# Patient Record
Sex: Female | Born: 1960 | Race: White | Hispanic: No | Marital: Married | Smoking: Never smoker
Health system: Southern US, Community
[De-identification: ages and names within clinical notes are randomized; demographics above are authoritative.]

## PROBLEM LIST (undated history)

## (undated) HISTORY — PX: REDUCTION MAMMAPLASTY: SUR839

## (undated) HISTORY — PX: ABDOMINAL HYSTERECTOMY: SHX81

---

## 2004-02-28 ENCOUNTER — Ambulatory Visit: Payer: Self-pay

## 2005-07-23 ENCOUNTER — Ambulatory Visit: Payer: Self-pay | Admitting: Obstetrics and Gynecology

## 2006-07-29 ENCOUNTER — Ambulatory Visit: Payer: Self-pay | Admitting: Obstetrics and Gynecology

## 2007-08-13 ENCOUNTER — Ambulatory Visit: Payer: Self-pay | Admitting: Obstetrics and Gynecology

## 2008-07-13 ENCOUNTER — Ambulatory Visit: Payer: Self-pay | Admitting: Internal Medicine

## 2008-08-23 ENCOUNTER — Ambulatory Visit: Payer: Self-pay | Admitting: Obstetrics and Gynecology

## 2009-01-23 ENCOUNTER — Ambulatory Visit: Payer: Self-pay | Admitting: Sports Medicine

## 2009-07-05 ENCOUNTER — Ambulatory Visit: Payer: Self-pay | Admitting: Podiatry

## 2010-11-27 ENCOUNTER — Ambulatory Visit: Payer: Self-pay | Admitting: Obstetrics and Gynecology

## 2010-11-29 ENCOUNTER — Ambulatory Visit: Payer: Self-pay | Admitting: Obstetrics and Gynecology

## 2011-02-08 ENCOUNTER — Ambulatory Visit: Payer: Self-pay | Admitting: Internal Medicine

## 2011-03-18 ENCOUNTER — Ambulatory Visit: Payer: Self-pay | Admitting: Obstetrics and Gynecology

## 2011-03-18 LAB — BASIC METABOLIC PANEL
Calcium, Total: 8.9 mg/dL (ref 8.5–10.1)
Chloride: 103 mmol/L (ref 98–107)
Creatinine: 0.7 mg/dL (ref 0.60–1.30)
EGFR (African American): >60
EGFR (Non-African Amer.): >60
Glucose: 117 mg/dL — ABNORMAL HIGH (ref 65–99)
Potassium: 3.7 mmol/L (ref 3.5–5.1)
Sodium: 140 mmol/L (ref 136–145)

## 2011-03-18 LAB — CBC
HCT: 38.9 % (ref 35.0–47.0)
HGB: 13.3 g/dL (ref 12.0–16.0)
MCH: 33.1 pg (ref 26.0–34.0)
MCV: 97 fL (ref 80–100)
Platelet: 214 10*3/uL (ref 150–440)
RBC: 4 10*6/uL (ref 3.80–5.20)
WBC: 6.4 10*3/uL (ref 3.6–11.0)

## 2011-03-18 LAB — PREGNANCY, URINE: Pregnancy Test, Urine: NEGATIVE m[IU]/mL

## 2011-03-19 ENCOUNTER — Ambulatory Visit: Payer: Self-pay | Admitting: Obstetrics and Gynecology

## 2011-03-20 LAB — HEMATOCRIT: HCT: 35.8 % (ref 35.0–47.0)

## 2011-03-21 LAB — PATHOLOGY REPORT

## 2014-07-03 NOTE — Op Note (Signed)
PATIENT NAME:  Angie Allen, Angie Allen MR#:  [id number] DATE OF BIRTH:  04-Mar-1961  DATE OF PROCEDURE:  03/19/2011  PREOPERATIVE DIAGNOSIS: Severe pain, midline, unrelieved by medical management; most likely adenomyosis.   POSTOPERATIVE DIAGNOSES:  1. Severe pain, midline, unrelieved by medical management; most likely adenomyosis.  2. Adenomyosis.   PROCEDURE: Total laparoscopic hysterectomy with bilateral salpingectomy.   SURGEON: Elliot Gurney, MD  ASSISTANT: Dr. Vena Austria   ESTIMATED BLOOD LOSS: Approximately 100 mL.   FINDINGS: Approximately 12 week size very globular, very round uterus that was quite spongy upon palpation. Tube on the left scarred to the sidewall. Normal right tube. Normal ovaries bilaterally.   DESCRIPTION OF PROCEDURE: Patient was taken to the Operating Room, placed in supine position. After adequate general endotracheal anesthesia was instilled, the patient was prepped and draped in the usual sterile fashion. Timeout was performed. Foley catheter was placed and the side-opening speculum was placed in the patient's vagina. The anterior lip of the cervix was grasped with a single-tooth tenaculum. Uterus was sounded and the V-care retractor was placed into the uterus. The single-tooth tenaculum was removed and the Foley catheter was removed. Attention was then turned to the patient's umbilicus where it was injected with Marcaine. Incision was placed into the patient's umbilicus. The Veress needle was then placed and after two clicks hanging drop test, fluid instillation test, and fluid aspiration test showed proper placement of the Veress needle. CO2 was placed on low flow. When tympany was heard around the liver, CO2 was placed on high flow. The Veress needle was then removed and the Xcel trocar was placed through direct visualization into the umbilicus. The aforementioned findings were seen. The patient was then placed in Trendelenburg and two ports were placed in the  lower abdominal area, first on the left and then the right; a 5 mm was placed on the left and an 11 mm on the right. Attention was then turned to the tubes and ovaries which were doused with Marcaine. The tubes were grasped bilaterally and the Harmonic scalpel was then used to separate the tube from the ovary through the tubo-ovarian peritoneum. This was carried to the cornua of the uterus. The Bladder flap was then created with the Harmonic scalpel and carried down to the bladder. The bladder was pushed off the cervix. This was then done on the left side as well as the right side. Serial bites were taken down the sides of the uterus with the Harmonic scalpel. The uterine arteries were identified and skeletonized. These were then grasped and cauterized with the Kleppinger. They were then cut across with the Harmonic scalpel. The V-care manipulator was moved to find the cervicovaginal connection. The Harmonic scalpel was then used to cut through the vaginal mucosa at the level of the cervical cup. Cervix hole was made around the entire vagina. Attention was then turned to the vagina where the uterus was pulled out of the vagina with the ring forceps and the V-care. Vaginal plug was placed with K-Y jelly around it. Attention was then turned to the top where the Endo Stitch was then used to run a running suture from the right to the left side incorporating the uterosacral ligaments into the suture for support. The peritoneum was grasped and sutured down to the posterior peritoneum to cover the opening. The patient's abdomen was then irrigated with copious amounts of warm normal saline. Vaginal exam was done to be sure that there were no openings in between the sutures.  Attention was then turned to the belly. Again all fluid was aspirated. Interceed was placed across the vaginal incision. The patient was taken out of Trendelenburg and laid supine. Bowels were allowed to fall back into the pelvis. Air was allowed to  escape from the abdomen. The trocars were removed under indirect visualization. A UR-6 was used to close the left incision under direct visualization to be sure no bowels were involved. UR-6 was then used to close the umbilical incision under direct visualization. The skin edges were approximated with 4-0 Monocryl. 4-0 Monocryl was used to approximate the skin edges in an exterior stitch on the left lower quadrant as the bleeding would not stop. The 4-0 Monocryl was used to approximate the skin edges on the other incision. Dermabond was placed on top of this and the bandages were placed. The patient was then laid supine. Indigo carmine had been given. The ureters were identified. Clear blue urine was noted in the Foley bag. The patient was laid supine and taken to recovery after having tolerated the procedure well.    ____________________________ Elliot Gurneyarrie C. Ashelyn Mccravy, MD cck:cms D: 03/20/2011 12:19:37 ET T: 03/20/2011 13:16:43 ET JOB#: [id number]  cc: Elliot Gurneyarrie C. Renelda Kilian, MD, <Dictator> Elliot GurneyARRIE C Kaziah Krizek MD ELECTRONICALLY SIGNED 03/27/2011 16:20

## 2014-12-29 ENCOUNTER — Other Ambulatory Visit: Payer: Self-pay | Admitting: Obstetrics and Gynecology

## 2015-01-02 ENCOUNTER — Other Ambulatory Visit: Payer: Self-pay | Admitting: Obstetrics and Gynecology

## 2015-01-02 DIAGNOSIS — Z1231 Encounter for screening mammogram for malignant neoplasm of breast: Secondary | ICD-10-CM

## 2015-01-05 ENCOUNTER — Inpatient Hospital Stay
Admission: RE | Admit: 2015-01-05 | Discharge: 2015-01-05 | Disposition: A | Discharge status: Home / Self Care | Payer: Self-pay | Source: Ambulatory Visit | Attending: *Deleted | Admitting: *Deleted

## 2015-01-05 ENCOUNTER — Other Ambulatory Visit: Payer: Self-pay | Admitting: *Deleted

## 2015-01-05 DIAGNOSIS — Z9289 Personal history of other medical treatment: Secondary | ICD-10-CM

## 2015-01-06 ENCOUNTER — Ambulatory Visit: Payer: Self-pay

## 2015-01-12 ENCOUNTER — Ambulatory Visit
Admission: RE | Admit: 2015-01-12 | Discharge: 2015-01-12 | Disposition: A | Discharge status: Home / Self Care | Payer: No Typology Code available for payment source | Source: Ambulatory Visit | Attending: Obstetrics and Gynecology | Admitting: Obstetrics and Gynecology

## 2015-01-12 DIAGNOSIS — Z1231 Encounter for screening mammogram for malignant neoplasm of breast: Secondary | ICD-10-CM | POA: Diagnosis present

## 2016-02-06 ENCOUNTER — Other Ambulatory Visit: Payer: Self-pay | Admitting: Obstetrics and Gynecology

## 2016-02-06 DIAGNOSIS — Z1231 Encounter for screening mammogram for malignant neoplasm of breast: Secondary | ICD-10-CM

## 2016-03-13 ENCOUNTER — Ambulatory Visit
Admission: RE | Admit: 2016-03-13 | Discharge: 2016-03-13 | Disposition: A | Discharge status: Home / Self Care | Payer: Managed Care, Other (non HMO) | Source: Ambulatory Visit | Attending: Obstetrics and Gynecology | Admitting: Obstetrics and Gynecology

## 2016-03-13 DIAGNOSIS — Z1231 Encounter for screening mammogram for malignant neoplasm of breast: Secondary | ICD-10-CM | POA: Diagnosis not present

## 2016-04-03 ENCOUNTER — Other Ambulatory Visit: Payer: Self-pay | Admitting: Family Medicine

## 2016-04-03 DIAGNOSIS — Z13820 Encounter for screening for osteoporosis: Secondary | ICD-10-CM

## 2016-05-29 ENCOUNTER — Ambulatory Visit
Admission: RE | Admit: 2016-05-29 | Discharge: 2016-05-29 | Disposition: A | Discharge status: Home / Self Care | Payer: Managed Care, Other (non HMO) | Source: Ambulatory Visit | Attending: Family Medicine | Admitting: Family Medicine

## 2016-05-29 DIAGNOSIS — Z780 Asymptomatic menopausal state: Secondary | ICD-10-CM | POA: Diagnosis not present

## 2016-05-29 DIAGNOSIS — Z13820 Encounter for screening for osteoporosis: Secondary | ICD-10-CM | POA: Insufficient documentation

## 2017-01-27 ENCOUNTER — Other Ambulatory Visit: Payer: Self-pay | Admitting: Sports Medicine

## 2017-01-27 DIAGNOSIS — M25831 Other specified joint disorders, right wrist: Secondary | ICD-10-CM

## 2017-02-11 ENCOUNTER — Other Ambulatory Visit: Payer: Self-pay | Admitting: Obstetrics and Gynecology

## 2017-02-11 DIAGNOSIS — Z1231 Encounter for screening mammogram for malignant neoplasm of breast: Secondary | ICD-10-CM

## 2017-02-20 ENCOUNTER — Other Ambulatory Visit: Payer: Self-pay

## 2017-02-20 DIAGNOSIS — I10 Essential (primary) hypertension: Secondary | ICD-10-CM

## 2017-02-20 NOTE — Progress Notes (Signed)
Angie Allen  DOB 11/06/1960  DX Hyperlipemia   [facility]

## 2017-02-24 ENCOUNTER — Ambulatory Visit (INDEPENDENT_AMBULATORY_CARE_PROVIDER_SITE_OTHER)
Admission: RE | Admit: 2017-02-24 | Discharge: 2017-02-24 | Disposition: A | Discharge status: Home / Self Care | Payer: Self-pay | Source: Ambulatory Visit | Attending: Cardiovascular Disease | Admitting: Cardiovascular Disease

## 2017-02-24 DIAGNOSIS — I10 Essential (primary) hypertension: Secondary | ICD-10-CM

## 2017-03-17 ENCOUNTER — Ambulatory Visit
Admission: RE | Admit: 2017-03-17 | Discharge: 2017-03-17 | Disposition: A | Discharge status: Home / Self Care | Payer: 59 | Source: Ambulatory Visit | Attending: Obstetrics and Gynecology | Admitting: Obstetrics and Gynecology

## 2017-03-17 DIAGNOSIS — Z1231 Encounter for screening mammogram for malignant neoplasm of breast: Secondary | ICD-10-CM

## 2018-04-21 ENCOUNTER — Other Ambulatory Visit: Payer: Self-pay | Admitting: Obstetrics and Gynecology

## 2018-04-21 ENCOUNTER — Other Ambulatory Visit: Payer: Self-pay | Admitting: Family Medicine

## 2018-04-21 DIAGNOSIS — Z1231 Encounter for screening mammogram for malignant neoplasm of breast: Secondary | ICD-10-CM

## 2018-05-04 ENCOUNTER — Ambulatory Visit
Admission: RE | Admit: 2018-05-04 | Discharge: 2018-05-04 | Disposition: A | Discharge status: Home / Self Care | Payer: No Typology Code available for payment source | Source: Ambulatory Visit | Attending: Family Medicine | Admitting: Family Medicine

## 2018-05-04 DIAGNOSIS — Z1231 Encounter for screening mammogram for malignant neoplasm of breast: Secondary | ICD-10-CM | POA: Insufficient documentation

## 2019-03-01 ENCOUNTER — Other Ambulatory Visit: Payer: Self-pay | Admitting: Physician Assistant

## 2019-03-01 DIAGNOSIS — IMO0001 Reserved for inherently not codable concepts without codable children: Secondary | ICD-10-CM

## 2019-03-01 DIAGNOSIS — H9041 Sensorineural hearing loss, unilateral, right ear, with unrestricted hearing on the contralateral side: Secondary | ICD-10-CM

## 2019-03-13 ENCOUNTER — Ambulatory Visit
Admission: RE | Admit: 2019-03-13 | Discharge: 2019-03-13 | Disposition: A | Discharge status: Home / Self Care | Payer: 59 | Source: Ambulatory Visit | Attending: Physician Assistant | Admitting: Physician Assistant

## 2019-03-13 ENCOUNTER — Other Ambulatory Visit: Payer: Self-pay

## 2019-03-13 DIAGNOSIS — IMO0001 Reserved for inherently not codable concepts without codable children: Secondary | ICD-10-CM

## 2019-03-13 DIAGNOSIS — H9041 Sensorineural hearing loss, unilateral, right ear, with unrestricted hearing on the contralateral side: Secondary | ICD-10-CM | POA: Diagnosis present

## 2019-03-13 MED ORDER — GADOBUTROL 1 MMOL/ML IV SOLN
6.0000 mL | Freq: Once | INTRAVENOUS | Status: AC | PRN
  Administered 2019-03-13: 09:00:00 6 mL via INTRAVENOUS

## 2019-03-17 ENCOUNTER — Other Ambulatory Visit: Payer: No Typology Code available for payment source

## 2019-03-17 ENCOUNTER — Ambulatory Visit: Admission: RE | Admit: 2019-03-17 | Payer: Managed Care, Other (non HMO) | Source: Ambulatory Visit

## 2019-03-29 ENCOUNTER — Other Ambulatory Visit: Payer: Self-pay | Admitting: Family Medicine

## 2019-03-29 DIAGNOSIS — Z1231 Encounter for screening mammogram for malignant neoplasm of breast: Secondary | ICD-10-CM

## 2019-04-19 ENCOUNTER — Other Ambulatory Visit: Payer: Self-pay | Admitting: Internal Medicine

## 2019-04-19 DIAGNOSIS — Z1231 Encounter for screening mammogram for malignant neoplasm of breast: Secondary | ICD-10-CM

## 2019-06-02 ENCOUNTER — Ambulatory Visit
Admission: RE | Admit: 2019-06-02 | Discharge: 2019-06-02 | Disposition: A | Discharge status: Home / Self Care | Payer: No Typology Code available for payment source | Source: Ambulatory Visit | Attending: Internal Medicine | Admitting: Internal Medicine

## 2019-06-02 DIAGNOSIS — Z1231 Encounter for screening mammogram for malignant neoplasm of breast: Secondary | ICD-10-CM | POA: Insufficient documentation

## 2019-06-07 ENCOUNTER — Other Ambulatory Visit: Payer: Self-pay | Admitting: Internal Medicine

## 2019-06-07 DIAGNOSIS — R928 Other abnormal and inconclusive findings on diagnostic imaging of breast: Secondary | ICD-10-CM

## 2019-06-18 ENCOUNTER — Ambulatory Visit
Admission: RE | Admit: 2019-06-18 | Discharge: 2019-06-18 | Disposition: A | Discharge status: Home / Self Care | Payer: No Typology Code available for payment source | Source: Ambulatory Visit | Attending: Internal Medicine | Admitting: Internal Medicine

## 2019-06-18 DIAGNOSIS — R928 Other abnormal and inconclusive findings on diagnostic imaging of breast: Secondary | ICD-10-CM

## 2019-09-26 ENCOUNTER — Other Ambulatory Visit: Payer: Self-pay

## 2019-09-26 ENCOUNTER — Emergency Department: Payer: 59

## 2019-09-26 ENCOUNTER — Encounter: Payer: Self-pay | Admitting: Emergency Medicine

## 2019-09-26 ENCOUNTER — Emergency Department
Admission: EM | Admit: 2019-09-26 | Discharge: 2019-09-26 | Disposition: A | Discharge status: Home / Self Care | Payer: 59 | Attending: Emergency Medicine | Admitting: Emergency Medicine

## 2019-09-26 DIAGNOSIS — R05 Cough: Secondary | ICD-10-CM | POA: Diagnosis present

## 2019-09-26 DIAGNOSIS — J069 Acute upper respiratory infection, unspecified: Secondary | ICD-10-CM | POA: Diagnosis not present

## 2019-09-26 DIAGNOSIS — Z20822 Contact with and (suspected) exposure to covid-19: Secondary | ICD-10-CM | POA: Diagnosis not present

## 2019-09-26 DIAGNOSIS — R0781 Pleurodynia: Secondary | ICD-10-CM | POA: Insufficient documentation

## 2019-09-26 LAB — SARS CORONAVIRUS 2 BY RT PCR (HOSPITAL ORDER, PERFORMED IN ~~LOC~~ HOSPITAL LAB): SARS Coronavirus 2: NEGATIVE

## 2019-09-26 MED ORDER — BENZONATATE 100 MG PO CAPS
100.0000 mg | ORAL_CAPSULE | Freq: Three times a day (TID) | ORAL | 0 refills | Status: AC | PRN
Start: 2019-09-26 — End: ?

## 2019-09-26 MED ORDER — HYDROCOD POLST-CPM POLST ER 10-8 MG/5ML PO SUER
5.0000 mL | Freq: Once | ORAL | Status: AC
  Administered 2019-09-26: 5 mL via ORAL
  Filled 2019-09-26: qty 5

## 2019-09-26 MED ORDER — BENZONATATE 100 MG PO CAPS
200.0000 mg | ORAL_CAPSULE | Freq: Once | ORAL | Status: AC
  Administered 2019-09-26: 07:00:00 200 mg via ORAL
  Filled 2019-09-26: qty 2

## 2019-09-26 MED ORDER — HYDROCODONE-CHLORPHENIRAMINE 5-4 MG/5ML PO SOLN: 5.0000 mL | Freq: Four times a day (QID) | ORAL | 0 refills | Status: AC | PRN

## 2019-09-26 NOTE — Discharge Instructions (Addendum)
As we discussed, your chest x-ray was clear with no obvious explanation for your persistent cough.  We discussed other options such as steroids but you would prefer to hold off at this time.  I recommend you continue to use your albuterol inhaler, perhaps as many as 4 puffs every 2 hours as needed for persistent cough (bronchospasm) which should help open up your airway and hopefully prevent as much coughing.  I also recommend you continue taking your daily Zyrtec.  I provided a prescription for both Tessalon Perles and cough syrup with hydrocodone; please take both according to label instructions.  We performed a COVID-19 swab prior to your discharge and I encourage you to follow-up on MyChart later this morning for the results.  If you test positive, you only need to return to the emergency department if you develop new or worsening symptoms, and I encourage you to isolate yourself from others.  You can find additional information at [email or web address]  I also encourage you to take over-the-counter ibuprofen and/or Tylenol according to label instructions for the musculoskeletal pain in your ribs that seems to be resulting from the persistent cough.    Return to the emergency department if you develop new or worsening symptoms that concern you.

## 2019-09-26 NOTE — ED Provider Notes (Signed)
[facility] Emergency Department Provider Note  ____________________________________________   First MD Initiated Contact with Patient 09/26/19 [phone]     (approximate)  I have reviewed the triage vital signs and the nursing notes.   HISTORY  Chief Complaint Cough    HPI Angie Allen is a 59 y.o. female reports no chronic medical issues other than seasonal allergies and presents for evaluation of a persistent and somewhat worsening cough for the last 2 weeks.  Her cough has been severe and persistent enough that she is having pain in the right side of her ribs when she coughs.  She has had no traumatic injury.  She has no memory of any aspiration event.  She denies acid reflux and does not take any blood pressure medications.  She said that usually once a year she will get a coughing episode like this but it does not typically happen at this time of year and more commonly occurs in the winter.  Her husband, a local cardiologist, is at bedside and provides additional history as well and states that her symptoms seem to be somewhat allergic and that she may have been exposed to outdoor allergens which started the process and now she has not been able to shake it.  Neither she nor her husband have had the COVID-19 vaccination.  She denies fever/chills, sore throat, nausea, vomiting, chest pain other than the rib pain associated with cough, abdominal pain, and dysuria.  Nothing in particular seems to make the symptoms better and it seems to be worse at night and when she lies down flat.  She has an albuterol inhaler that she occasionally uses but said that it does not help.  Her usual dose is 2 puffs.  She has taken prednisone in the past but had what she considers to be a negative reaction to it with facial swelling and redness and general malaise.        History reviewed. No pertinent past medical history.  There are no problems to display for this  patient.   Past Surgical History:  Procedure Laterality Date  . ABDOMINAL HYSTERECTOMY    . REDUCTION MAMMAPLASTY Bilateral 16+yrs ago    Prior to Admission medications   Medication Sig Start Date End Date Taking? Authorizing Provider  benzonatate (TESSALON PERLES) 100 MG capsule Take 1 capsule (100 mg total) by mouth 3 (three) times daily as needed for cough. 09/26/19   Loleta Rose, MD  HYDROcodone-Chlorpheniramine 5-4 MG/5ML SOLN Take 5 mLs by mouth every 6 (six) hours as needed. 09/26/19   Loleta Rose, MD    Allergies Fish allergy  Family History  Problem Relation Age of Onset  . Breast cancer Neg Hx     Social History Social History   Tobacco Use  . Smoking status: Never Smoker  . Smokeless tobacco: Never Used  Substance Use Topics  . Alcohol use: Not on file  . Drug use: Not on file    Review of Systems Constitutional: No fever/chills Eyes: No visual changes. ENT: No sore throat. Cardiovascular: Denies chest pain. Respiratory: Persistent and worsening cough over 2 weeks, otherwise no difficulty breathing. Gastrointestinal: No abdominal pain.  No nausea, no vomiting.   Genitourinary: Negative for dysuria. Musculoskeletal: Right-sided rib pain under the right breast when she coughs.  Negative for neck pain.  Negative for back pain. Integumentary: Negative for rash. Neurological: Negative for headaches, focal weakness or numbness.   ____________________________________________   PHYSICAL EXAM:  VITAL SIGNS: ED Triage Vitals [  09/26/19 0356]  Enc Vitals Group     BP (!) 160/82     Pulse Rate 70     Resp 18     Temp 99 F (37.2 C)     Temp Source Oral     SpO2 99 %     Weight 52.2 kg (115 lb)     Height 1.524 m (5')     Head Circumference      Peak Flow      Pain Score 2     Pain Loc      Pain Edu?      Excl. in GC?     Constitutional: Alert and oriented.  Eyes: Conjunctivae are normal.  Head: Atraumatic. Nose: No  congestion/rhinnorhea. Mouth/Throat: Patient is wearing a mask. Neck: No stridor.  No meningeal signs.   Cardiovascular: Normal rate, regular rhythm. Good peripheral circulation. Grossly normal heart sounds. Respiratory: Frequent dry cough during my history and physical.  However her lung sounds are clear to auscultation bilaterally with no wheezes, rales, nor rhonchi.  No accessory muscle usage or intercostal retractions. Gastrointestinal: Soft and nontender. No distention.  Musculoskeletal: No lower extremity tenderness nor edema. No gross deformities of extremities. Neurologic:  Normal speech and language. No gross focal neurologic deficits are appreciated.  Skin:  Skin is warm, dry and intact. Psychiatric: Mood and affect are normal. Speech and behavior are normal.  ____________________________________________   LABS (all labs ordered are listed, but only abnormal results are displayed)  Labs Reviewed  SARS CORONAVIRUS 2 BY RT PCR (HOSPITAL ORDER, PERFORMED IN [facility])   ____________________________________________  EKG  No indication for emergent EKG ____________________________________________  RADIOLOGY I, Loleta Rose, personally viewed and evaluated these images (plain radiographs) as part of my medical decision making, as well as reviewing the written report by the radiologist.  ED MD interpretation: No indication of acute abnormality on chest x-ray  Official radiology report(s): DG Chest 2 View  Result Date: 09/26/2019 CLINICAL DATA:  Cough and chest pain for 2 weeks. EXAM: CHEST - 2 VIEW COMPARISON:  None. FINDINGS: Midline trachea. Normal heart size and mediastinal contours. No pleural effusion or pneumothorax. Clear lungs. IMPRESSION: No acute cardiopulmonary disease. Electronically Signed   By: Jeronimo Greaves M.D.   On: 09/26/2019 05:03    ____________________________________________   PROCEDURES   Procedure(s) performed (including Critical  Care):  Procedures   ____________________________________________   INITIAL IMPRESSION / MDM / ASSESSMENT AND PLAN / ED COURSE  As part of my medical decision making, I reviewed the following data within the electronic MEDICAL RECORD NUMBER History obtained from family, Nursing notes reviewed and incorporated, Labs reviewed , Old chart reviewed, Radiograph reviewed  and Notes from prior ED visits   Differential diagnosis includes, but is not limited to, viral URI with cough including the possibility of COVID-19, persistent allergic cough, asthma, aspiration, community-acquired pneumonia, acid reflux, medication side effect.   The patient does not take any ACE inhibitors and denies any other symptoms of acid reflux.  This seems to be somewhat seasonal although this is unusual for her to have a cough during the summer.  She already takes a daily cetirizine.  Her chest x-ray is clear and her vital signs are reassuring including no hypoxemia, tachycardia, nor tachypnea.  She has a persistent dry cough during my interview but otherwise no shortness of breath.  I discussed with her and her husband multiple different treatment options and we all agreed that additional evaluation and work-up  was not necessary.  I offered a lidocaine nebulizer treatment but she declines at this time.  I also offered a single dose of Decadron to help over there at least the next few days but she declined due to her negative experience with prednisone in the past.  I gave her a dose of Tessalon 200 mg by mouth and Tussionex with hydrocodone 5 mL while in the emergency department and wrote her prescriptions for the same.  She also agreed to a coronavirus PCR test and knows to check in MyChart for the results.  She will follow up with her primary care provider and I gave my usual and customary return precautions.           ____________________________________________  FINAL CLINICAL IMPRESSION(S) / ED DIAGNOSES  Final  diagnoses:  Viral URI with cough     MEDICATIONS GIVEN DURING THIS VISIT:  Medications  benzonatate (TESSALON) capsule 200 mg (200 mg Oral Given 09/26/19 0637)  chlorpheniramine-HYDROcodone (TUSSIONEX) 10-8 MG/5ML suspension 5 mL (5 mLs Oral Given 09/26/19 0637)     ED Discharge Orders         Ordered    HYDROcodone-Chlorpheniramine 5-4 MG/5ML SOLN  Every 6 hours PRN     Discontinue  Reprint     09/26/19 0627    benzonatate (TESSALON PERLES) 100 MG capsule  3 times daily PRN     Discontinue  Reprint     09/26/19 9563          *Please note:  Angie Allen was evaluated in Emergency Department on 09/26/2019 for the symptoms described in the history of present illness. She was evaluated in the context of the global COVID-19 pandemic, which necessitated consideration that the patient might be at risk for infection with the SARS-CoV-2 virus that causes COVID-19. Institutional protocols and algorithms that pertain to the evaluation of patients at risk for COVID-19 are in a state of rapid change based on information released by regulatory bodies including the CDC and federal and state organizations. These policies and algorithms were followed during the patient's care in the ED.  Some ED evaluations and interventions may be delayed as a result of limited staffing during and after the pandemic.*  Note:  This document was prepared using Dragon voice recognition software and may include unintentional dictation errors.   Loleta Rose, MD 09/26/19 [phone]

## 2019-09-26 NOTE — ED Triage Notes (Signed)
Patient with complaint of cough times two weeks that has become worse. Patient denies any fevers. Patient states that she has pain to right side of her ribs from coughing.

## 2020-01-05 ENCOUNTER — Other Ambulatory Visit: Payer: Self-pay

## 2020-01-05 MED ORDER — DOXYCYCLINE HYCLATE 20 MG PO TABS: 20.0000 mg | ORAL_TABLET | Freq: Two times a day (BID) | ORAL | 3 refills | Status: AC

## 2020-01-05 NOTE — Progress Notes (Signed)
Doxycycline RF ?

## 2021-07-19 ENCOUNTER — Other Ambulatory Visit: Payer: Self-pay

## 2021-07-19 MED ORDER — BIMATOPROST 0.03 % EX SOLN: CUTANEOUS | 11 refills | Status: AC

## 2021-07-19 NOTE — Progress Notes (Signed)
1 RF of [place], [place] per Dr. Roseanne Reno. aw ?

## 2021-09-18 IMAGING — CR DG CHEST 2V
2 series · 2 of 2 positions shown · non-contrast
Comparison: None.

CLINICAL DATA: Cough and chest pain for 2 weeks.

EXAM:
CHEST - 2 VIEW

[chest pa]
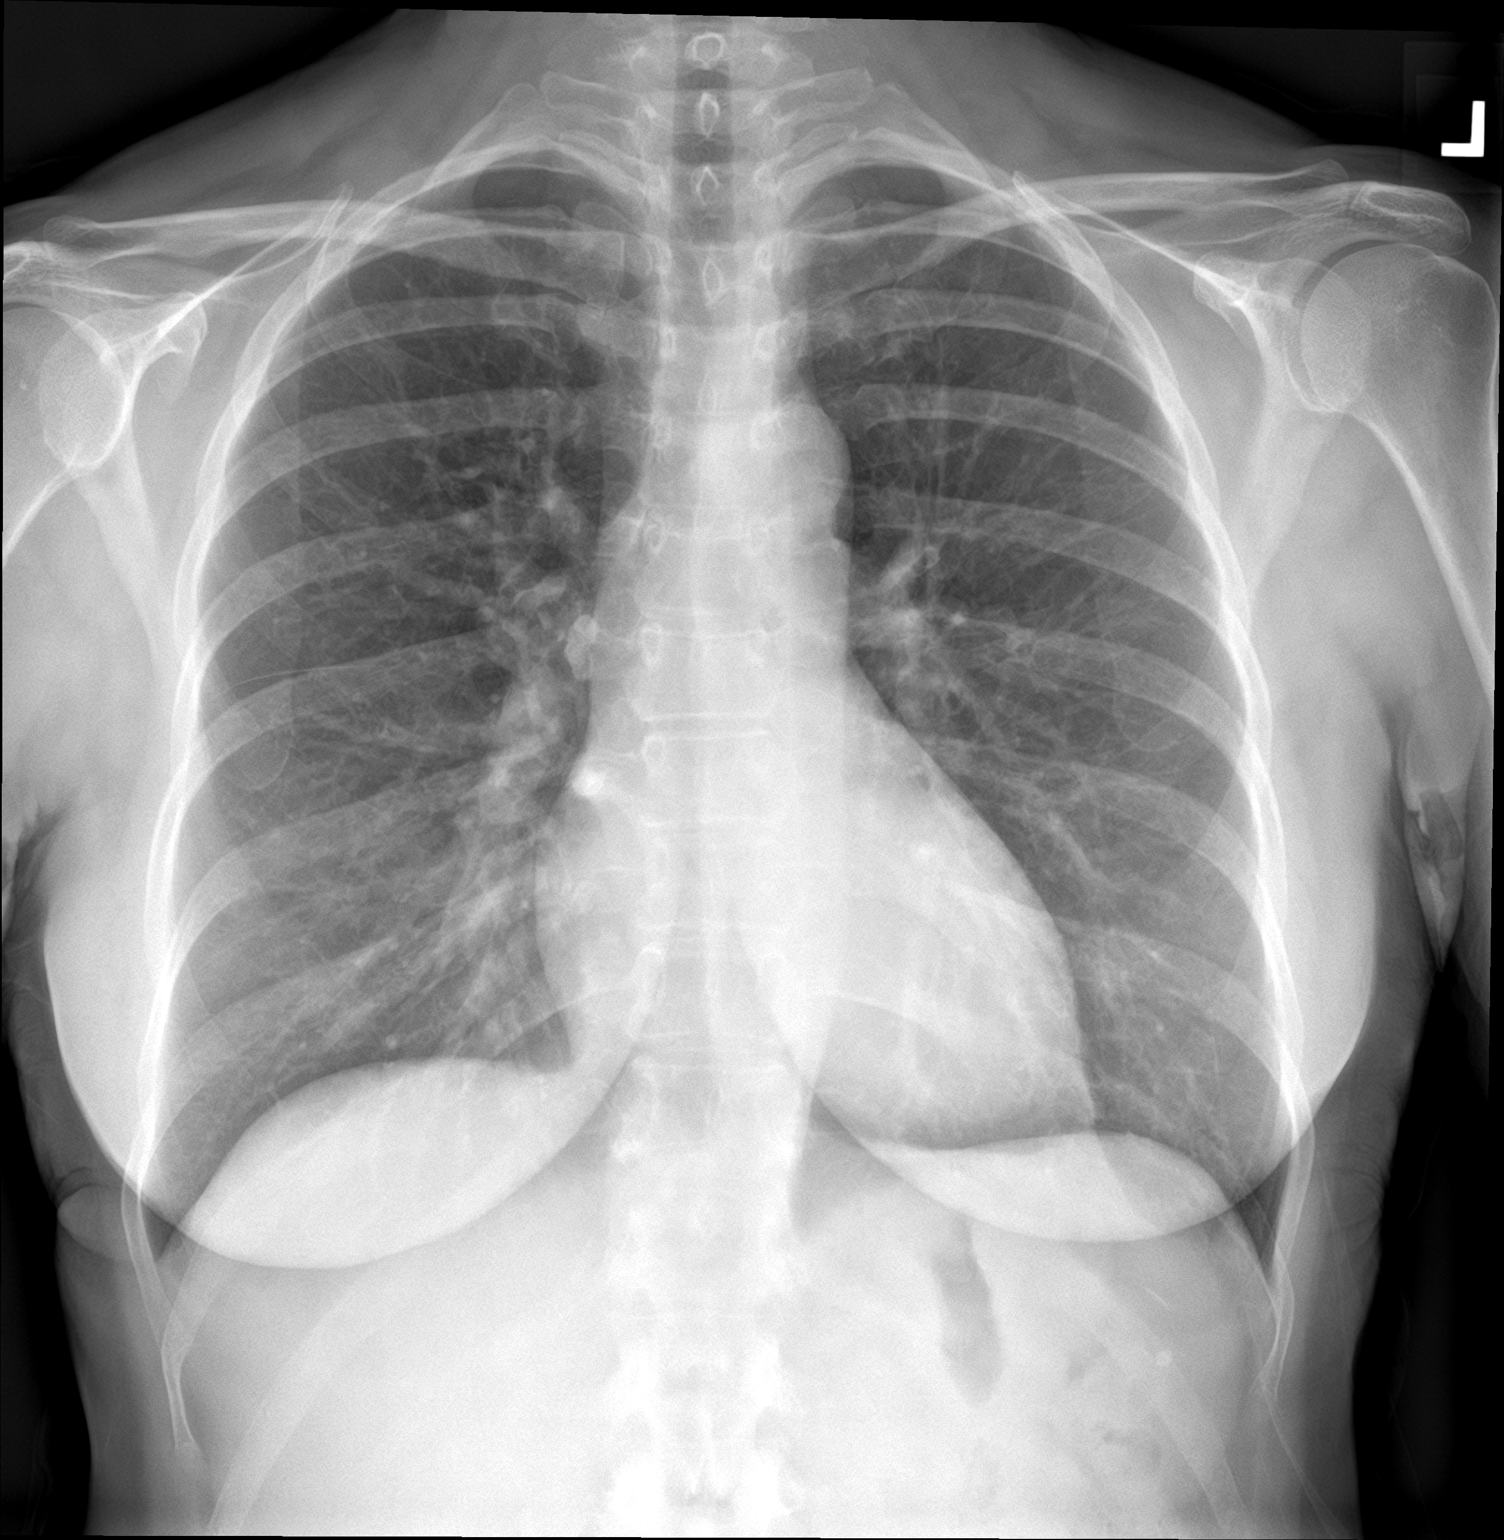

[chest lat]
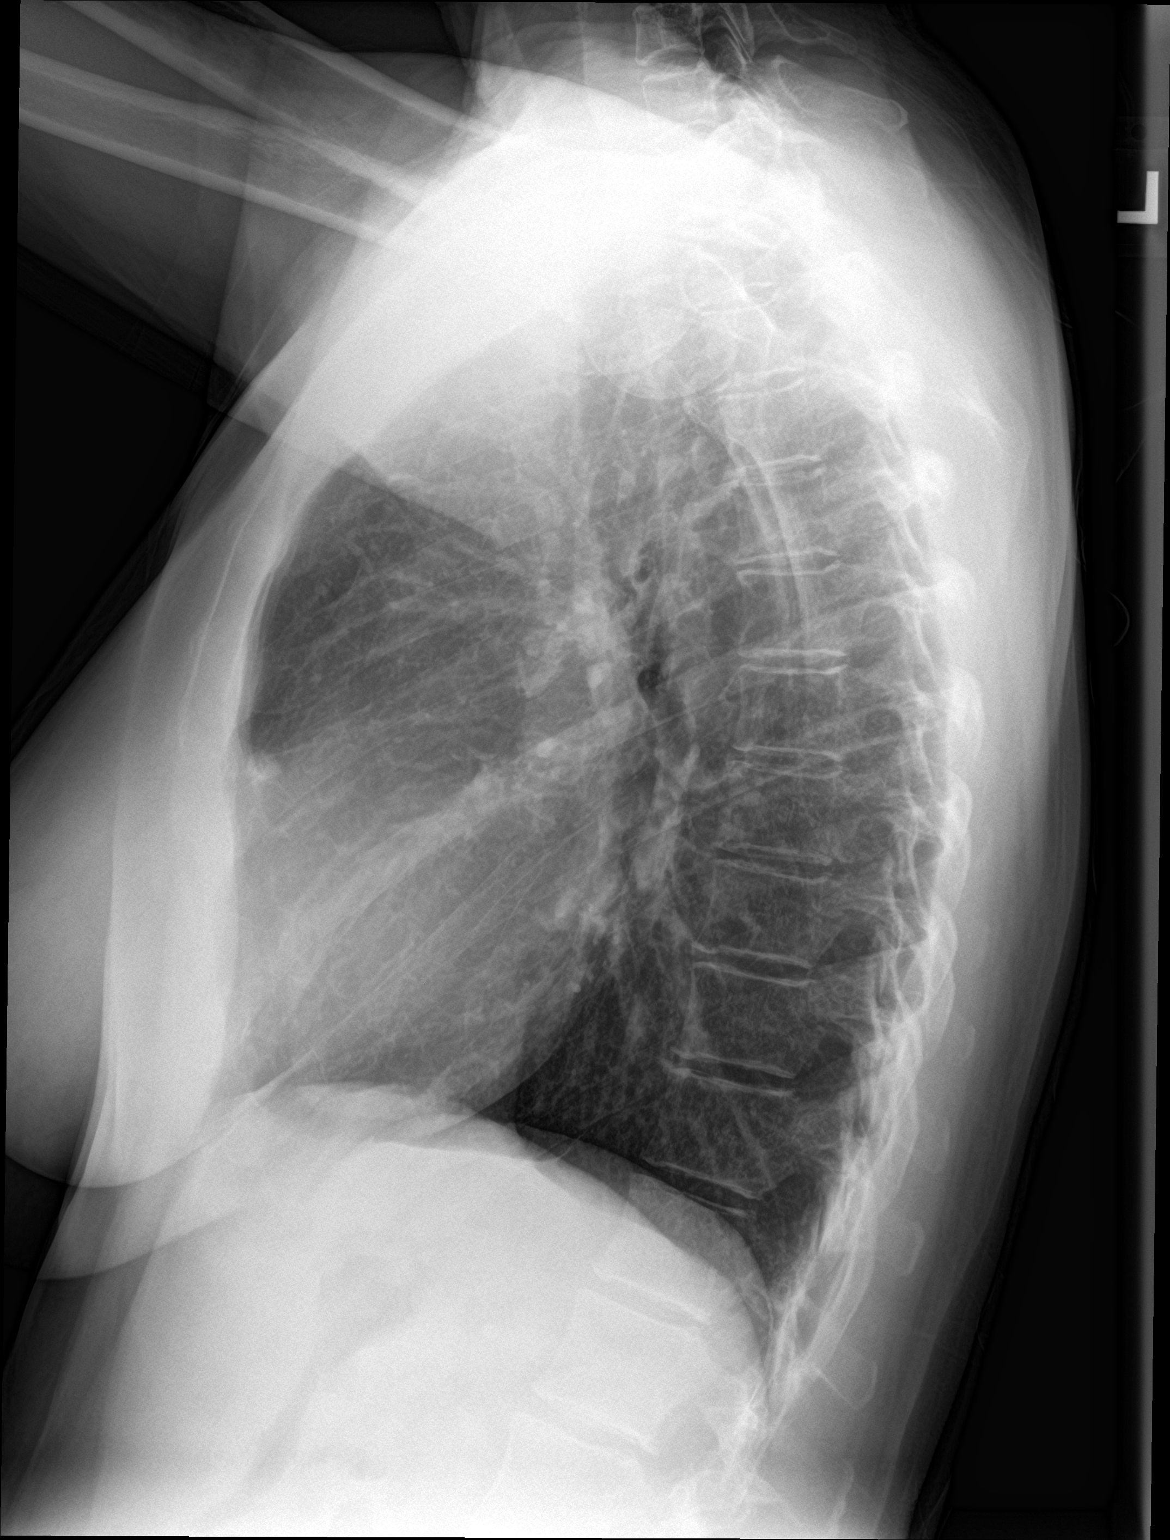

[2 of 2 positions shown; findings below may reference images not displayed]

FINDINGS: Midline trachea. Normal heart size and mediastinal contours. No
pleural effusion or pneumothorax. Clear lungs.
IMPRESSION: No acute cardiopulmonary disease.
